# Patient Record
Sex: Female | Born: 2000 | Race: Black or African American | Hispanic: No | Marital: Single | Smoking: Never smoker
Health system: Southern US, Community
[De-identification: ages and names within clinical notes are randomized; demographics above are authoritative.]

## PROBLEM LIST (undated history)

## (undated) DIAGNOSIS — L509 Urticaria, unspecified: Secondary | ICD-10-CM

## (undated) DIAGNOSIS — R55 Syncope and collapse: Secondary | ICD-10-CM

## (undated) DIAGNOSIS — J45909 Unspecified asthma, uncomplicated: Secondary | ICD-10-CM

## (undated) HISTORY — DX: Urticaria, unspecified: L50.9

---

## 2018-03-02 ENCOUNTER — Encounter (HOSPITAL_COMMUNITY): Payer: Self-pay

## 2018-03-02 DIAGNOSIS — R55 Syncope and collapse: Secondary | ICD-10-CM | POA: Diagnosis present

## 2018-03-02 LAB — CBC
HEMATOCRIT: 36.6 % (ref 36.0–46.0)
Hemoglobin: 11.9 g/dL — ABNORMAL LOW (ref 12.0–15.0)
MCH: 27 pg (ref 26.0–34.0)
MCHC: 32.5 g/dL (ref 30.0–36.0)
MCV: 83.2 fL (ref 78.0–100.0)
Platelets: 345 10*3/uL (ref 150–400)
RBC: 4.4 MIL/uL (ref 3.87–5.11)
RDW: 13.8 % (ref 11.5–15.5)
WBC: 7.7 10*3/uL (ref 4.0–10.5)

## 2018-03-02 LAB — BASIC METABOLIC PANEL
ANION GAP: 10 (ref 5–15)
BUN: 13 mg/dL (ref 6–20)
CHLORIDE: 106 mmol/L (ref 101–111)
CO2: 26 mmol/L (ref 22–32)
Calcium: 9.6 mg/dL (ref 8.9–10.3)
Creatinine, Ser: 1.05 mg/dL — ABNORMAL HIGH (ref 0.44–1.00)
Glucose, Bld: 94 mg/dL (ref 65–99)
POTASSIUM: 3.7 mmol/L (ref 3.5–5.1)
SODIUM: 142 mmol/L (ref 135–145)

## 2018-03-02 LAB — I-STAT BETA HCG BLOOD, ED (MC, WL, AP ONLY)

## 2018-03-02 LAB — CBG MONITORING, ED: Glucose-Capillary: 104 mg/dL — ABNORMAL HIGH (ref 65–99)

## 2018-03-02 NOTE — ED Notes (Signed)
While doing EKG, pt ask "when can I go home, I don't like hospitals"  Pt was informed that it will be best if she is seen by the doctor before she decides to leave.

## 2018-03-02 NOTE — ED Triage Notes (Signed)
BIB EMS from [facility] for syncopal episode witnessed by classmates.   EMS Vitals BP 138/96 P 110 RR 16 CBG 104

## 2018-03-03 ENCOUNTER — Emergency Department (HOSPITAL_COMMUNITY)
Admission: EM | Admit: 2018-03-03 | Discharge: 2018-03-03 | Disposition: A | Discharge status: Home / Self Care | Payer: BLUE CROSS/BLUE SHIELD | Attending: Emergency Medicine | Admitting: Emergency Medicine

## 2018-03-03 DIAGNOSIS — R55 Syncope and collapse: Secondary | ICD-10-CM

## 2018-03-03 HISTORY — DX: Syncope and collapse: R55

## 2018-03-03 LAB — URINALYSIS, ROUTINE W REFLEX MICROSCOPIC
Bilirubin Urine: NEGATIVE
Glucose, UA: NEGATIVE mg/dL
Hgb urine dipstick: NEGATIVE
Ketones, ur: NEGATIVE mg/dL
LEUKOCYTES UA: NEGATIVE
Nitrite: NEGATIVE
PROTEIN: NEGATIVE mg/dL
Specific Gravity, Urine: 1.018 (ref 1.005–1.030)
pH: 5 (ref 5.0–8.0)

## 2018-03-03 NOTE — ED Notes (Signed)
Pt aware of urine sample but pt is too out of it to go at this time.  RN notified.

## 2018-03-03 NOTE — ED Provider Notes (Signed)
[facility]-EMERGENCY DEPT Provider Note   CSN: [id number] Arrival date & time: 03/02/18  2213     History   Chief Complaint Chief Complaint  Patient presents with  . Syncope    HPI Cheryl Blanchard is a 17 y.o. female.  17 year old female presents to the emergency department for evaluation of a syncopal event.  She states that she was practicing a step routine for many hours when she felt lightheaded and passed out.  This has happened to her on 1 other occasion associated with hyperventilation from an anxiety attack.  She states that she feels as though she was breathing more rapidly prior to syncope today.  She denies any preceding chest pain, fevers, viral illness.  Patient complaining of mild leg soreness from frequent dance practice.  This is bilateral.  She initially was experiencing a headache, but this has resolved.  She was encouraged to come for evaluation by her friends.  She states that she is feeling fine and would like to go home.     Past Medical History:  Diagnosis Date  . Syncope     There are no active problems to display for this patient.   History reviewed. No pertinent surgical history.   OB History   None      Home Medications    Prior to Admission medications   Not on File    Family History History reviewed. No pertinent family history.  Social History Social History   Tobacco Use  . Smoking status: Never Smoker  . Smokeless tobacco: Never Used  Substance Use Topics  . Alcohol use: Not on file  . Drug use: Not on file     Allergies   Patient has no known allergies.   Review of Systems Review of Systems Ten systems reviewed and are negative for acute change, except as noted in the HPI.    Physical Exam Updated Vital Signs BP 116/63 (BP Location: Left Arm)   Pulse 90   Temp 99.2 F (37.3 C) (Oral)   Resp 16   Ht 5\' 6"  (1.676 m)   LMP  (LMP Unknown)   SpO2 100%   Physical Exam  Constitutional: She is  oriented to person, place, and time. She appears well-developed and well-nourished. No distress.  Nontoxic appearing in no distress.  HENT:  Head: Normocephalic and atraumatic.  Eyes: Conjunctivae and EOM are normal. No scleral icterus.  Neck: Normal range of motion.  Cardiovascular: Normal rate, regular rhythm and intact distal pulses.  Pulmonary/Chest: Effort normal. No stridor. No respiratory distress. She has no wheezes. She has no rales.  Lungs clear to auscultation bilaterally  Musculoskeletal: Normal range of motion.  Neurological: She is alert and oriented to person, place, and time. She exhibits normal muscle tone. Coordination normal.  GCS 15. Ambulatory with steady gait.  Skin: Skin is warm and dry. No rash noted. She is not diaphoretic. No erythema. No pallor.  Psychiatric: She has a normal mood and affect. Her behavior is normal.  Nursing note and vitals reviewed.    ED Treatments / Results  Labs (all labs ordered are listed, but only abnormal results are displayed) Labs Reviewed  BASIC METABOLIC PANEL - Abnormal; Notable for the following components:      Result Value   Creatinine, Ser 1.05 (*)    All other components within normal limits  CBC - Abnormal; Notable for the following components:   Hemoglobin 11.9 (*)    All other components within normal limits  CBG MONITORING, ED - Abnormal; Notable for the following components:   Glucose-Capillary 104 (*)    All other components within normal limits  URINALYSIS, ROUTINE W REFLEX MICROSCOPIC  I-STAT BETA HCG BLOOD, ED ([facility], [facility], [facility] ONLY)    EKG EKG Interpretation  Date/Time:  Tuesday March 02 2018 22:45:33 EDT Ventricular Rate:  97 PR Interval:    QRS Duration: 87 QT Interval:  348 QTC Calculation: 442 R Axis:   86 Text Interpretation:  Sinus rhythm Normal ECG No previous ECGs available Confirmed by Paula Libra ([id number]) on 03/02/2018 10:53:10 PM   Radiology No results found.  Procedures Procedures  (including critical care time)  Medications Ordered in ED Medications - No data to display   Initial Impression / Assessment and Plan / ED Course  I have reviewed the triage vital signs and the nursing notes.  Pertinent labs & imaging results that were available during my care of the patient were reviewed by me and considered in my medical decision making (see chart for details).     Patient presenting after a syncopal event.  She is feeling at baseline and expresses desire for discharge.  She denies any preceding chest pain, fevers, viral illness.  She states that she has had similar syncopal events in the past for which she was followed by her doctor for a short period.  Laboratory work-up today is reassuring.  EKG without ischemic changes.  Orthostatic vital signs are stable.  No concerning exam findings today.  I have encouraged the patient to drink fluids regularly and to eat frequent meals.  She has also been advised to get plenty of sleep nightly.  I do not believe further emergent evaluation is indicated.  [place] syncope score is negative.  Return precautions discussed and provided. Patient discharged in stable condition with no unaddressed concerns.   Final Clinical Impressions(s) / ED Diagnoses   Final diagnoses:  Syncope, unspecified syncope type    ED Discharge Orders    None       Antony Madura, PA-C 03/03/18 0333    Paula Libra, MD 03/03/18 [phone]

## 2018-03-03 NOTE — Discharge Instructions (Signed)
Your workup in the emergency department today was reassuring.  We recommend follow-up with a primary care doctor.  Get plenty of rest and drink plenty of fluids.  Eat regular meals.  You may return for any new or concerning symptoms.

## 2018-12-19 ENCOUNTER — Encounter (HOSPITAL_COMMUNITY): Payer: Self-pay | Admitting: Emergency Medicine

## 2018-12-19 ENCOUNTER — Emergency Department (HOSPITAL_COMMUNITY)
Admission: EM | Admit: 2018-12-19 | Discharge: 2018-12-19 | Disposition: A | Discharge status: Home / Self Care | Payer: BLUE CROSS/BLUE SHIELD | Attending: Emergency Medicine | Admitting: Emergency Medicine

## 2018-12-19 ENCOUNTER — Other Ambulatory Visit: Payer: Self-pay

## 2018-12-19 DIAGNOSIS — J45909 Unspecified asthma, uncomplicated: Secondary | ICD-10-CM | POA: Diagnosis not present

## 2018-12-19 DIAGNOSIS — L509 Urticaria, unspecified: Secondary | ICD-10-CM | POA: Diagnosis present

## 2018-12-19 DIAGNOSIS — F17200 Nicotine dependence, unspecified, uncomplicated: Secondary | ICD-10-CM | POA: Insufficient documentation

## 2018-12-19 DIAGNOSIS — T7840XA Allergy, unspecified, initial encounter: Secondary | ICD-10-CM

## 2018-12-19 HISTORY — DX: Unspecified asthma, uncomplicated: J45.909

## 2018-12-19 MED ORDER — DIPHENHYDRAMINE HCL 25 MG PO CAPS
25.0000 mg | ORAL_CAPSULE | Freq: Once | ORAL | Status: AC
  Administered 2018-12-19: 25 mg via ORAL
  Filled 2018-12-19: qty 1

## 2018-12-19 MED ORDER — PREDNISONE 20 MG PO TABS
60.0000 mg | ORAL_TABLET | Freq: Once | ORAL | Status: AC
  Administered 2018-12-19: 60 mg via ORAL
  Filled 2018-12-19: qty 3

## 2018-12-19 MED ORDER — FAMOTIDINE 20 MG PO TABS: 20.0000 mg | ORAL_TABLET | Freq: Two times a day (BID) | ORAL | 0 refills | Status: DC

## 2018-12-19 MED ORDER — DIPHENHYDRAMINE HCL 25 MG PO TABS: 25.0000 mg | ORAL_TABLET | Freq: Three times a day (TID) | ORAL | 0 refills | Status: DC | PRN

## 2018-12-19 MED ORDER — FAMOTIDINE 20 MG PO TABS
20.0000 mg | ORAL_TABLET | Freq: Once | ORAL | Status: AC
  Administered 2018-12-19: 20 mg via ORAL
  Filled 2018-12-19: qty 1

## 2018-12-19 MED ORDER — PREDNISONE 20 MG PO TABS: 40.0000 mg | ORAL_TABLET | Freq: Every day | ORAL | 0 refills | Status: DC

## 2018-12-19 NOTE — Discharge Instructions (Addendum)
Evaluated today for allergic reaction. I have have given you a steroid prescription.  Please take as prescribed.  Please also continue to take Benadryl, 25 mg every 8 hours over the next 3 days.  Please also continue to take Pepcid, 20 mg over the next 3 days.  If you develop any sensation of throat closing, facial swelling, lightheaded or dizziness, nausea or vomiting, shortness of breath please return to the emergency department for evaluation.

## 2018-12-19 NOTE — ED Provider Notes (Signed)
[facility] EMERGENCY [facility] Provider Note   CSN: [id number] Arrival date & time: 12/19/18  [id number]   History   Chief Complaint Chief Complaint  Patient presents with  . Allergic Reaction    HPI Cheryl Blanchard is a 18 y.o. female with past medical history significant for asthma who presents for evaluation of allergic reaction.  Patient states she was [employer] yesterday evening, which she has had previously when she returned home she states that she noticed a "welt" to her left leg and lower portion of her back.  Patient states approximately 30 minutes later she noticed multiple areas that were pleuritic in nature.  Patient states she took #2, 12.5 mg p.o. Benadryl yesterday evening approximately 7 PM yesterday evening.  Patient states she woke up this morning and noticed increase area of urticaria to her face, posterior back, chest, abdomen as well as bilateral upper and lower extremities.  Patient denies new foods, new lotions, perfumes.  Patient denies previous history of allergic reactions.  Denies sensation of oral swelling or sensation of throat closing.  Denies fever, chills, nausea, vomiting, neck pain, neck stiffness, sore throat, facial swelling, cough, wheezing, shortness of breath, chest pain, abdominal pain, diarrhea, dysuria, constipation.  Denies additional aggravating or alleviating factors.  History provided by patient.  No interpreter was used.  HPI  Past Medical History:  Diagnosis Date  . Asthma   . Syncope     There are no active problems to display for this patient.   No past surgical history on file.   OB History   No obstetric history on file.      Home Medications    Prior to Admission medications   Medication Sig Start Date End Date Taking? Authorizing Provider  diphenhydrAMINE (BENADRYL) 25 MG tablet Take 1 tablet (25 mg total) by mouth every 8 (eight) hours as needed for up to 3 days for itching. 12/19/18 12/22/18  Adlyn Fife,  Ahmod Gillespie A, PA-C  famotidine (PEPCID) 20 MG tablet Take 1 tablet (20 mg total) by mouth 2 (two) times daily. 12/19/18   Kyrese Gartman A, PA-C  predniSONE (DELTASONE) 20 MG tablet Take 2 tablets (40 mg total) by mouth daily for 3 days. 12/19/18 12/22/18  Arely Tinner A, PA-C    Family History No family history on file.  Social History Social History   Tobacco Use  . Smoking status: Current Every Day Smoker  . Smokeless tobacco: Never Used  Substance Use Topics  . Alcohol use: Not on file  . Drug use: Not on file     Allergies   Patient has no known allergies.   Review of Systems Review of Systems  Constitutional: Negative.   HENT: Negative.   Eyes: Negative.   Respiratory: Negative.   Cardiovascular: Negative.   Gastrointestinal: Negative.   Genitourinary: Negative.   Musculoskeletal: Negative.   Skin: Positive for wound.  Neurological: Negative.   All other systems reviewed and are negative.    Physical Exam Updated Vital Signs BP 119/74 (BP Location: Left Arm)   Pulse 80   Temp 98.2 F (36.8 C)   Resp 16   SpO2 97%   Physical Exam Vitals signs and nursing note reviewed.  Constitutional:      General: She is not in acute distress.    Appearance: She is well-developed. She is not ill-appearing, toxic-appearing or diaphoretic.  HENT:     Head: Normocephalic and atraumatic.     Nose: Nose normal.     Mouth/Throat:  Comments: Posterior oropharynx clear.  No oropharyngeal swelling.  Tonsils without edema or exudate.  Uvula midline deviation.  No oropharyngeal lesions.  No drooling, trismus, phonation changes. Eyes:     Extraocular Movements: Extraocular movements intact.     Pupils: Pupils are equal, round, and reactive to light.  Neck:     Musculoskeletal: Normal range of motion.     Comments: No neck stiffness or neck rigidity Cardiovascular:     Rate and Rhythm: Normal rate.     Pulses: Normal pulses.     Heart sounds: Normal heart sounds. No  murmur. No gallop.   Pulmonary:     Effort: No respiratory distress.     Comments: Clear to auscultation bilaterally without wheeze, rhonchi or rales.  No tachypnea.  Oxygen saturation 98% on room air with good waveform.  Able to speak in full sentences without difficulty.  No evidence of acute respiratory distress. Abdominal:     General: There is no distension.     Comments: Soft, nontender without rebound or guarding.  Musculoskeletal: Normal range of motion.     Comments: Moves all extremities without difficulty.  Skin:    General: Skin is warm and dry.     Comments: Multiple areas of urticaria varying sizes to face, posterior back, chest, abdomen, bilateral upper and lower extremities.  No oropharyngeal lesions.  Neurological:     Mental Status: She is alert.      ED Treatments / Results  Labs (all labs ordered are listed, but only abnormal results are displayed) Labs Reviewed - No data to display  EKG None  Radiology No results found.  Procedures Procedures (including critical care time)  Medications Ordered in ED Medications  famotidine (PEPCID) tablet 20 mg (20 mg Oral Given 12/19/18 1018)  predniSONE (DELTASONE) tablet 60 mg (60 mg Oral Given 12/19/18 1018)  diphenhydrAMINE (BENADRYL) capsule 25 mg (25 mg Oral Given 12/19/18 1018)     Initial Impression / Assessment and Plan / ED Course  I have reviewed the triage vital signs and the nursing notes.  Pertinent labs & imaging results that were available during my care of the patient were reviewed by me and considered in my medical decision making (see chart for details).  18 year old female who presented as well presents for evaluation after allergic reaction.  Afebrile, nonseptic, non-ill-appearing.  No evidence of anaphylaxis.  Lungs clear to auscultation bilaterally no wheeze, rales.  No oropharyngeal lesions.  Patient with varying sizes of urticaria to face, posterior back, chest, abdomen as well as bilateral  upper and lower extremities.  Will give patient Pepcid, Benadryl, steroids and reevaluate.  No evidence of acute respiratory distress.  We will continue to monitor.  1115: On reevaluation, patient with resolvment of urticaria except for #2, 5 mm lesions to ventral surface of right wrist.  No evidence of anaphylaxis.  No sensation of throat closing, cough, oropharyngeal swelling, nausea, vomiting, dizziness, palpitations.  No additional areas of urticaria after treatment.  Patient is hemodynamically stable and appropriate for DC home at this time.  She has not had any evidence of anaphylaxis after exposure approximately 18 hours ago.  Symptoms have resolved with treatment emergency department.  Will DC patient home on Pepcid, steroids as well as Benadryl.  Discussed with patient and friend strict return precautions.  Patient voiced understanding and is agreeable for follow-up.    Final Clinical Impressions(s) / ED Diagnoses   Final diagnoses:  Allergic reaction, initial encounter    ED Discharge Orders  Ordered    famotidine (PEPCID) 20 MG tablet  2 times daily     12/19/18 1123    predniSONE (DELTASONE) 20 MG tablet  Daily     12/19/18 1123    diphenhydrAMINE (BENADRYL) 25 MG tablet  Every 8 hours PRN     12/19/18 1123           Tiffanee Mcnee A, PA-C 12/19/18 1139    Azalia Bilisampos, Kevin, MD 12/19/18 1627

## 2018-12-19 NOTE — ED Notes (Signed)
Patient verbalizes understanding of discharge instructions. Opportunity for questioning and answers were provided. Armband removed by staff, pt discharged from ED ambulatory.   

## 2018-12-19 NOTE — ED Triage Notes (Signed)
hives that started last night , whelps on abd back  That are itchy, face feels itchy , pt handling secretions well, denies dyspahia

## 2018-12-21 ENCOUNTER — Emergency Department (HOSPITAL_COMMUNITY)
Admission: EM | Admit: 2018-12-21 | Discharge: 2018-12-22 | Disposition: A | Discharge status: Home / Self Care | Payer: BLUE CROSS/BLUE SHIELD | Attending: Emergency Medicine | Admitting: Emergency Medicine

## 2018-12-21 ENCOUNTER — Other Ambulatory Visit: Payer: Self-pay

## 2018-12-21 DIAGNOSIS — Z79899 Other long term (current) drug therapy: Secondary | ICD-10-CM | POA: Diagnosis not present

## 2018-12-21 DIAGNOSIS — J45909 Unspecified asthma, uncomplicated: Secondary | ICD-10-CM | POA: Insufficient documentation

## 2018-12-21 DIAGNOSIS — F17210 Nicotine dependence, cigarettes, uncomplicated: Secondary | ICD-10-CM | POA: Insufficient documentation

## 2018-12-21 DIAGNOSIS — L509 Urticaria, unspecified: Secondary | ICD-10-CM | POA: Insufficient documentation

## 2018-12-21 NOTE — ED Triage Notes (Signed)
Patient c/o that she is breaking out in hives. Hives are located on face and all extremities, but not trunk.

## 2018-12-22 LAB — RPR: RPR Ser Ql: NONREACTIVE

## 2018-12-22 MED ORDER — KETOROLAC TROMETHAMINE 30 MG/ML IJ SOLN
30.0000 mg | Freq: Once | INTRAMUSCULAR | Status: AC
  Administered 2018-12-22: 30 mg via INTRAVENOUS
  Filled 2018-12-22: qty 1

## 2018-12-22 MED ORDER — METHYLPREDNISOLONE SODIUM SUCC 125 MG IJ SOLR
125.0000 mg | Freq: Once | INTRAMUSCULAR | Status: AC
  Administered 2018-12-22: 125 mg via INTRAVENOUS
  Filled 2018-12-22: qty 2

## 2018-12-22 MED ORDER — FAMOTIDINE IN NACL 20-0.9 MG/50ML-% IV SOLN
20.0000 mg | INTRAVENOUS | Status: AC
  Administered 2018-12-22: 20 mg via INTRAVENOUS
  Filled 2018-12-22: qty 50

## 2018-12-22 MED ORDER — DIPHENHYDRAMINE HCL 50 MG/ML IJ SOLN
12.5000 mg | Freq: Once | INTRAMUSCULAR | Status: AC
  Administered 2018-12-22: 12.5 mg via INTRAVENOUS
  Filled 2018-12-22: qty 1

## 2018-12-22 MED ORDER — PREDNISONE 20 MG PO TABS: 40.0000 mg | ORAL_TABLET | Freq: Every day | ORAL | 0 refills | Status: DC

## 2018-12-22 MED ORDER — SODIUM CHLORIDE 0.9 % IV BOLUS
1000.0000 mL | Freq: Once | INTRAVENOUS | Status: AC
  Administered 2018-12-22: 1000 mL via INTRAVENOUS

## 2018-12-22 MED ORDER — DIPHENHYDRAMINE HCL 50 MG/ML IJ SOLN
25.0000 mg | Freq: Once | INTRAMUSCULAR | Status: AC
  Administered 2018-12-22: 25 mg via INTRAVENOUS
  Filled 2018-12-22: qty 1

## 2018-12-22 NOTE — Discharge Instructions (Signed)
We recommend continued use of Benadryl and Pepcid for management of itching.  You have been prescribed a steroid taper.  Take as prescribed until finished.  You may also benefit from use of daily Zyrtec or Claritin.  We recommend close outpatient follow-up for allergy testing.  You may return to the emergency department if symptoms worsen, such as if you develop associated fever, difficulty breathing, difficulty swallowing.

## 2018-12-22 NOTE — ED Provider Notes (Signed)
[facility] EMERGENCY DEPARTMENT Provider Note   CSN: [id number] Arrival date & time: 12/21/18  1942     History   Chief Complaint Chief Complaint  Patient presents with  . Urticaria    HPI Cheryl Blanchard is a 18 y.o. female.  18 year old female presents to the emergency department for evaluation of an urticarial rash.  Rash has been present over the past 4 days.  She was seen on day 1 of symptoms and treated in the emergency department with oral prednisone, Benadryl, Pepcid.  She had some mild improvement to her symptoms, but noted recurrence of rash later that evening.  She was [facility] student [facility] center on Monday and received a steroid shot, but has not had any improvement with this.  Continues to note constant itching throughout her body.  She has had a burning discomfort to her inner and posterior thighs.  She denies any lip or tongue swelling, difficulty breathing, difficulty swallowing, fevers, vomiting.  No new soaps, lotions, detergents, medications.  Denies contact with persons with similar rash.  The history is provided by the patient. No language interpreter was used.    Past Medical History:  Diagnosis Date  . Asthma   . Syncope     There are no active problems to display for this patient.   No past surgical history on file.   OB History   No obstetric history on file.      Home Medications    Prior to Admission medications   Medication Sig Start Date End Date Taking? Authorizing Provider  diphenhydrAMINE (BENADRYL) 25 MG tablet Take 1 tablet (25 mg total) by mouth every 8 (eight) hours as needed for up to 3 days for itching. 12/19/18 12/22/18 Yes Henderly, Britni A, PA-C  famotidine (PEPCID) 20 MG tablet Take 1 tablet (20 mg total) by mouth 2 (two) times daily. 12/19/18  Yes Henderly, Britni A, PA-C  predniSONE (DELTASONE) 20 MG tablet Take 2 tablets (40 mg total) by mouth daily. Take 40 mg by mouth daily for 3 days, then 20mg  by mouth  daily for 3 days, then 10mg  daily for 3 days 12/22/18   Antony Madura, PA-C    Family History No family history on file.  Social History Social History   Tobacco Use  . Smoking status: Current Every Day Smoker  . Smokeless tobacco: Never Used  Substance Use Topics  . Alcohol use: Not on file  . Drug use: Not on file     Allergies   Patient has no known allergies.   Review of Systems Review of Systems Ten systems reviewed and are negative for acute change, except as noted in the HPI.    Physical Exam Updated Vital Signs BP 119/74   Pulse 65   Temp 97.7 F (36.5 C) (Oral)   Resp 17   Ht 5\' 6"  (1.676 m)   Wt 62.1 kg   SpO2 97%   BMI 22.11 kg/m   Physical Exam Vitals signs and nursing note reviewed.  Constitutional:      General: She is not in acute distress.    Appearance: She is well-developed. She is not diaphoretic.     Comments: Nontoxic appearing and in NAD  HENT:     Head: Normocephalic and atraumatic.     Right Ear: External ear normal.     Left Ear: External ear normal.     Mouth/Throat:     Mouth: Mucous membranes are moist.     Comments:  No angioedema. Patient tolerating secretions without difficulty. Normal phonation. Eyes:     General: No scleral icterus.    Conjunctiva/sclera: Conjunctivae normal.  Neck:     Musculoskeletal: Normal range of motion.  Cardiovascular:     Rate and Rhythm: Normal rate and regular rhythm.     Pulses: Normal pulses.  Pulmonary:     Effort: Pulmonary effort is normal. No respiratory distress.     Breath sounds: No stridor. No wheezing or rales.     Comments: Respirations even and unlabored Musculoskeletal: Normal range of motion.  Skin:    General: Skin is warm and dry.     Coloration: Skin is not pale.     Findings: Rash present. No erythema.     Comments: Maculopapular, pruritic, raised, blanching rash c/w urticaria noted throughout body; mostly on BLE and trunk. [place] erythematous rash to bilateral hands.    Neurological:     Mental Status: She is alert and oriented to person, place, and time.     Coordination: Coordination normal.  Psychiatric:        Behavior: Behavior normal.      ED Treatments / Results  Labs (all labs ordered are listed, but only abnormal results are displayed) Labs Reviewed  RPR    EKG None  Radiology No results found.  Procedures Procedures (including critical care time)  Medications Ordered in ED Medications  diphenhydrAMINE (BENADRYL) injection 25 mg (25 mg Intravenous Given 12/22/18 0347)  famotidine (PEPCID) IVPB 20 mg premix (0 mg Intravenous Stopped 12/22/18 0559)  methylPREDNISolone sodium succinate (SOLU-MEDROL) 125 mg/2 mL injection 125 mg (125 mg Intravenous Given 12/22/18 0347)  sodium chloride 0.9 % bolus 1,000 mL (0 mLs Intravenous Stopped 12/22/18 0559)  diphenhydrAMINE (BENADRYL) injection 12.5 mg (12.5 mg Intravenous Given 12/22/18 0600)  ketorolac (TORADOL) 30 MG/ML injection 30 mg (30 mg Intravenous Given 12/22/18 0559)    5:03 AM Hives less erythematous and pruritic, but still present. No difficulty breathing or swallowing. No hypoxia.   Initial Impression / Assessment and Plan / ED Course  I have reviewed the triage vital signs and the nursing notes.  Pertinent labs & imaging results that were available during my care of the patient were reviewed by me and considered in my medical decision making (see chart for details).     18 year old female is presenting for persistent urticaria x4 days.  She has no angioedema, difficulty breathing, difficulty swallowing.  Does have been stable without hypoxia.  She is afebrile.  Denies any new food ingestions, medications, soaps, lotions, detergents prior to onset of urticaria.  Was previously evaluated in the emergency department for same.  Discharged with 3 days of prednisone.  Also received a steroid shot at her student health clinic without relief.  She has diffuse urticaria on exam which has  moderately improved with IV Benadryl, steroids, Pepcid.  I have discussed the need for patient to follow-up for allergy testing giving persistent symptoms.  I do not feel she requires further emergent work-up or admission at this time.  She has been counseled on reasons to return to the emergency department including difficulty breathing or difficulty swallowing.  Patient discharged in stable condition with no unaddressed concerns.   Final Clinical Impressions(s) / ED Diagnoses   Final diagnoses:  Urticaria    ED Discharge Orders         Ordered    predniSONE (DELTASONE) 20 MG tablet  Daily     12/22/18 0647  Antony Madura, PA-C 12/22/18 6389    Nira Conn, MD 12/22/18 [phone]

## 2018-12-22 NOTE — ED Notes (Signed)
Patient verbalized understanding of dc instructions. Vss, ambulatory upon discharge.

## 2018-12-29 ENCOUNTER — Encounter: Payer: Self-pay | Admitting: Allergy

## 2018-12-29 ENCOUNTER — Ambulatory Visit: Payer: BLUE CROSS/BLUE SHIELD | Admitting: Allergy

## 2018-12-29 VITALS — BP 122/78 | HR 95 | Temp 98.3°F | Resp 18 | Ht 64.0 in | Wt 172.0 lb

## 2018-12-29 DIAGNOSIS — T783XXD Angioneurotic edema, subsequent encounter: Secondary | ICD-10-CM

## 2018-12-29 DIAGNOSIS — J4520 Mild intermittent asthma, uncomplicated: Secondary | ICD-10-CM

## 2018-12-29 DIAGNOSIS — L508 Other urticaria: Secondary | ICD-10-CM | POA: Diagnosis not present

## 2018-12-29 NOTE — Patient Instructions (Signed)
Hives  - at this time etiology of hives and swelling is unknown.  Hives can be caused by a variety of different triggers including illness/infection, foods, medications, stings, exercise, pressure, vibrations, extremes of temperature to name a few however majority of the time there is no identifiable trigger.  Your symptoms were acute (lasting <6 weeks).  If hives return and become chronic (>6 week) would recommend further evaluation with following labwork: CBC w diff, CMP, tryptase, hive panel, alpha-gal panel  - environmental allergy skin testing is positive to pollens and mold.  Allergen avoidance measures provided  - common food allergy skin testing is negative thus not concerned for food allergy at this time  - if hives return recommend taking the following: Zyrtec 10mg  1 tablet twice a day with Pepcid 20mg  1 tablet twice a day.   Let us know if his regimen is not effective for managing hives  - we briefly discussed Xolair monthly injections if hives become chronic and not controlled with high-dose antihistamine regimen as above.   Asthma  - have access to albuterol inhaler 2 puffs every 4-6 hours as needed for cough/wheeze/shortness of breath/chest tightness.  May use 15-20 minutes prior to activity.   Monitor frequency of use.    Asthma control goals:   Full participation in all desired activities (may need albuterol before activity)  Albuterol use two time or less a week on average (not counting use with activity)  Cough interfering with sleep two time or less a month  Oral steroids no more than once a year  No hospitalizations   Follow-up 3-4 months or sooner if needed

## 2018-12-29 NOTE — Progress Notes (Signed)
New Patient Note  RE: Cheryl Blanchard MRN: [id number] DOB: 02-12-2001 Date of Office Visit: 12/29/2018  Referring provider: Lupita Dawn, FNP Primary care provider: Patient, No Pcp Per  Chief Complaint: rash  History of present illness: Cheryl Blanchard is a 18 y.o. female presenting today for consultation for hives.  She presents today with her dad and aunt.  1.5 weeks ago she broke out in hives.  She provided pictures which are consistent with urticaria.  No previous history of hives.  She states she has not had any further hives since Monday of this week (2 days ago).  At the onset of the hives she went to the emergency room on January 26 and on exam she was noted to have "multiple areas of urticaria varying sizes to face, posterior back, chest, abdomen, bilateral upper and lower extremities."  She was treated with Pepcid, Benadryl and 60 mg of prednisone.  She was able to DC home to continue on Pepcid, Benadryl and the prednisone.  She states prior to the onset of hives she had eaten [employer] which she normally does without any problems.  She states she noticed that evening a welts on her leg and lower back and states that about 30 minutes later she had multiple areas of of hives that were extremely itchy.  She took Benadryl however the hives continued to spread and that she went to the emergency department as above.  The following day after the ED visit on 12/19/2018 she states she continued to have further outbreaks of hives despite the medications and she [facility] she [facility].  She states she continues to have further new outbreaks so the next day on January 28 she went back to the emergency department.  She was again noted to have a "macular papular, pruritic, raised, blanching rash consistent with urticaria noted throughout the body; mostly on BLE and trunk.  Lacy erythematous rash to bilateral hands."  She was treated with  Benadryl IV, Pepcid IV, Solu-Medrol IV, IV fluid, Benadryl IV and Toradol IV.  The hives did improve but did not completely resolve.  She was discharged to continue additional days of prednisone.  She does report lip swelling and eye puffiness with the hives.  No joint aches/pain.  No fevers.  No marks/bruising left behind.   No preceding illnesses, no new medications, no new foods, no stings or change in soaps/detergents/lotions.  She states she changed her sheets and bought fresh clothes and towels that would not have her detergent on it to make sure that was not a trigger.  She has a history of asthma.  She states ashtma is controlled but states triggers are being overheated, exercise/activity.  She has albuterol that she does use albuterol prior to activity.    No history of eczema or food allergy.  She denies having any significant symptoms consistent with allergic rhinitis or conjunctivitis.  She completed a study abroad from September to December in [place].  Review of systems: Review of Systems  Constitutional: Negative for chills, fever and malaise/fatigue.  HENT: Negative for congestion, ear discharge, ear pain, nosebleeds and sore throat.   Eyes: Negative for pain, discharge and redness.  Respiratory: Negative for cough, shortness of breath and wheezing.   Cardiovascular: Negative for chest pain.  Gastrointestinal: Negative for abdominal pain, constipation, diarrhea, heartburn, nausea and vomiting.  Musculoskeletal: Negative for joint pain.  Skin: Positive for itching and rash.  Neurological: Negative for headaches.  All other systems negative unless noted above in HPI  Past medical history: Past Medical History:  Diagnosis Date  . Asthma   . Syncope   . Urticaria     Past surgical history: History reviewed. No pertinent surgical history.  Family history:  Family History  Problem Relation Age of Onset  . Urticaria Father   . Polycystic kidney disease Father   .  Polycystic kidney disease Mother   . Polycystic kidney disease Paternal Grandmother   . Allergic rhinitis Neg Hx   . Angioedema Neg Hx   . Asthma Neg Hx   . Atopy Neg Hx   . Eczema Neg Hx   . Immunodeficiency Neg Hx     Social history: Family lives in [place].  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  She lives in a dorm that does not have carpeting with electric heating and central cooling.  There are no pets.  There is no known concern for water damage, mildew or roaches.  She denies a smoking history.  Medication List: Allergies as of 12/29/2018   No Known Allergies     Medication List       Accurate as of December 29, 2018  1:52 PM. Always use your most recent med list.        albuterol 108 (90 Base) MCG/ACT inhaler Commonly known as:  PROVENTIL HFA;VENTOLIN HFA Inhale into the lungs every 6 (six) hours as needed for wheezing or shortness of breath.       Known medication allergies: No Known Allergies   Physical examination: Blood pressure 122/78, pulse 95, temperature 98.3 F (36.8 C), temperature source Oral, resp. rate 18, height 5\' 4"  (1.626 m), weight 172 lb (78 kg), SpO2 97 %.  General: Alert, interactive, in no acute distress. HEENT: PERRLA, TMs pearly gray, turbinates non-edematous without discharge, post-pharynx non erythematous. Neck: Supple without lymphadenopathy. Lungs: Clear to auscultation without wheezing, rhonchi or rales. {no increased work of breathing. CV: Normal S1, S2 without murmurs. Abdomen: Nondistended, nontender. Skin: Warm and dry, without lesions or rashes. Extremities:  No clubbing, cyanosis or edema. Neuro:   Grossly intact.  Diagnositics/Labs:  Spirometry: FEV1: 2.41L 82%, FVC: 3.08L 93%, ratio consistent with Nonobstructive pattern  Allergy testing: Environmental allergy skin prick testing is positive to grass pollens, weed pollens, walnut tree pollen, botryitis cinera. 10 common food allergens are  negative. Intradermal testing is negative. Allergy testing results were read and interpreted by provider, documented by clinical staff.   Assessment and plan:   Acute urticaria with angioedema  - at this time etiology of hives and swelling is unknown.  Hives can be caused by a variety of different triggers including illness/infection, foods, medications, stings, exercise, pressure, vibrations, extremes of temperature to name a few however majority of the time there is no identifiable trigger.  Your symptoms were acute (lasting <6 weeks).  If hives return and become chronic (>6 week) would recommend further evaluation with following labwork: CBC w diff, CMP, tryptase, hive panel, alpha-gal panel  - environmental allergy skin testing is positive to pollens and mold.  Allergen avoidance measures provided  - common food allergy skin testing is negative thus not concerned for food allergy at this time  - if hives return recommend taking the following: Zyrtec 10mg  1 tablet twice a day with Pepcid 20mg  1 tablet twice a day.   Let us know if his regimen is not effective for managing hives  - we briefly discussed Xolair monthly injections  if hives become chronic and not controlled with high-dose antihistamine regimen as above.   Asthma  - have access to albuterol inhaler 2 puffs every 4-6 hours as needed for cough/wheeze/shortness of breath/chest tightness.  May use 15-20 minutes prior to activity.   Monitor frequency of use.    Asthma control goals:   Full participation in all desired activities (may need albuterol before activity)  Albuterol use two time or less a week on average (not counting use with activity)  Cough interfering with sleep two time or less a month  Oral steroids no more than once a year  No hospitalizations   Follow-up 3-4 months or sooner if needed     I appreciate the opportunity to take part in Cheryl Blanchard's care. Please do not hesitate to contact me with  questions.  Sincerely,   Margo AyeShaylar Jasmina Gendron, MD Allergy/Immunology [facility]

## 2018-12-30 ENCOUNTER — Ambulatory Visit: Payer: Self-pay | Admitting: Allergy and Immunology

## 2019-01-18 ENCOUNTER — Ambulatory Visit: Payer: Self-pay | Admitting: Allergy & Immunology

## 2019-04-01 ENCOUNTER — Ambulatory Visit: Payer: BLUE CROSS/BLUE SHIELD | Admitting: Allergy

## 2019-04-08 ENCOUNTER — Ambulatory Visit (INDEPENDENT_AMBULATORY_CARE_PROVIDER_SITE_OTHER): Payer: BLUE CROSS/BLUE SHIELD | Admitting: Allergy

## 2019-04-08 ENCOUNTER — Encounter: Payer: Self-pay | Admitting: Allergy

## 2019-04-08 DIAGNOSIS — T783XXD Angioneurotic edema, subsequent encounter: Secondary | ICD-10-CM

## 2019-04-08 DIAGNOSIS — L508 Other urticaria: Secondary | ICD-10-CM | POA: Diagnosis not present

## 2019-04-08 DIAGNOSIS — J4520 Mild intermittent asthma, uncomplicated: Secondary | ICD-10-CM

## 2019-04-08 NOTE — Patient Instructions (Addendum)
Hives  - at this time etiology of hives and swelling is is likely driven by allergens as her environmental allergy testing was positive to grass pollens, weed pollens, tree pollens and mold.  Hives can be caused by a variety of different triggers including aeroallergens, illness/infection, foods, medications, stings, exercise, pressure, vibrations, extremes of temperature to name a few however majority of the time there is no identifiable trigger.   Since she has continued to have outbreaks throughout has 3 months of hives are now considered chronic in nature and at her next visit would recommend further evaluation with following labwork: CBC w diff, CMP, tryptase, hive panel, alpha-gal panel  -For management of hives take the following: Zyrtec 10mg  or Xyzal 5 mg 1 tablet twice a day with Pepcid 20mg  1 tablet twice a day.   Let us know if his regimen is not effective for managing hives  -If needed for more control consider adding in Singulair.  If high-dose antihistamine regimen is not effective then we have briefly discussed Xolair monthly injections.  Asthma  - have access to albuterol inhaler 2 puffs every 4-6 hours as needed for cough/wheeze/shortness of breath/chest tightness.  May use 15-20 minutes prior to activity.   Monitor frequency of use.    Asthma control goals:   Full participation in all desired activities (may need albuterol before activity)  Albuterol use two time or less a week on average (not counting use with activity)  Cough interfering with sleep two time or less a month  Oral steroids no more than once a year  No hospitalizations   Follow-up 3-4 months or sooner if needed

## 2019-04-08 NOTE — Progress Notes (Signed)
RE: Cheryl Blanchard MRN: [id number] DOB: 2001/10/27 Date of Telemedicine Visit: 04/08/2019  Referring provider: No ref. provider found Primary care provider: Patient, No Pcp Per  Chief Complaint: Urticaria (she did have one flare up about a month ago when she moved back home. she thinks the pollen count may have been higher then. )   Telemedicine Follow Up Visit via Telephone: I connected with Cheryl Blanchard for a follow up on 04/08/19 by telephone and verified that I am speaking with the correct person using two identifiers.   I discussed the limitations, risks, security and privacy concerns of performing an evaluation and management service by telephone and the availability of in person appointments. I also discussed with the patient that there may be a patient responsible charge related to this service. The patient expressed understanding and agreed to proceed.  Patient is at home.  Provider is at the office.  Visit start time: 0855 Visit end time: 0908 Insurance consent/check in by: Marlene Bast Medical consent and medical assistant/nurse: Dorathy Daft B  History of Present Illness: She is a 18 y.o. female, who is being followed for urticaria with angioedema and asthma. Her previous allergy office visit was on 12/29/18 with Dr. Delorse Lek.   She states after her last visit that her hives and swelling did improve however college closed down due to coronavirus and she went back home to [place] and states that around the time she went home there was a high pollen activity and she had a big flare of her hives.  She states for the hives she took her Zyrtec or Xyzal as well as occasional Benadryl and also applying calamine lotion.  She states that the flare has since improved and that she may have just little breakouts here and there where the hives are not as big in size that can occur on her arms or legs.  This was about 1/2 months ago.  She also states at the same time of the flare of her hives and going home  she started feeling chest tightness sensations.  She states that she describes the feeling in her chest as like a lump sitting in her chest.  She was using her albuterol twice a day.  She states that this feeling has since resolved.  She did not require visits to urgent care or ED or to her primary care and also did not require any systemic steroids.  Assessment and Plan: Wynesha is a 18 y.o. female with: Chronic urticaria with angioedema  - at this time etiology of hives and swelling is is likely driven by allergens as her environmental allergy testing was positive to grass pollens, weed pollens, tree pollens and mold.  Hives can be caused by a variety of different triggers including aeroallergens, illness/infection, foods, medications, stings, exercise, pressure, vibrations, extremes of temperature to name a few however majority of the time there is no identifiable trigger.   Since she has continued to have outbreaks throughout has 3 months of hives are now considered chronic in nature and at her next visit would recommend further evaluation with following labwork: CBC w diff, CMP, tryptase, hive panel, alpha-gal panel  -For management of hives take the following: Zyrtec 10mg  or Xyzal 5 mg 1 tablet twice a day with Pepcid 20mg  1 tablet twice a day.   Let us know if his regimen is not effective for managing hives  -If needed for more control consider adding in Singulair.  If high-dose antihistamine regimen is not effective then we  have briefly discussed Xolair monthly injections.  Asthma, mild intermittent  - have access to albuterol inhaler 2 puffs every 4-6 hours as needed for cough/wheeze/shortness of breath/chest tightness.  May use 15-20 minutes prior to activity.   Monitor frequency of use.    Asthma control goals:   Full participation in all desired activities (may need albuterol before activity)  Albuterol use two time or less a week on average (not counting use with activity)  Cough  interfering with sleep two time or less a month  Oral steroids no more than once a year  No hospitalizations  Follow-up 3-4 months or sooner if needed    Diagnostics: None.  Medication List:  Current Outpatient Medications  Medication Sig Dispense Refill  . albuterol (PROVENTIL HFA;VENTOLIN HFA) 108 (90 Base) MCG/ACT inhaler Inhale into the lungs every 6 (six) hours as needed for wheezing or shortness of breath.    . levocetirizine (XYZAL) 5 MG tablet Take 5 mg by mouth daily as needed for allergies.     No current facility-administered medications for this visit.    Allergies: No Known Allergies I reviewed her past medical history, social history, family history, and environmental history and no significant changes have been reported from previous visit on 12/29/2018.  Review of Systems  Constitutional: Negative for chills and fever.  HENT: Negative for congestion, postnasal drip, rhinorrhea and sneezing.   Eyes: Negative for pain, discharge and itching.  Respiratory: Positive for chest tightness and shortness of breath. Negative for wheezing.   Cardiovascular: Negative.   Gastrointestinal: Negative.   Musculoskeletal: Negative for myalgias.  Skin: Positive for rash.  Neurological: Negative for headaches.   Objective: Physical Exam Not obtained as encounter was done via telephone.   Previous notes and tests were reviewed.  I discussed the assessment and treatment plan with the patient. The patient was provided an opportunity to ask questions and all were answered. The patient agreed with the plan and demonstrated an understanding of the instructions.   The patient was advised to call back or seek an in-person evaluation if the symptoms worsen or if the condition fails to improve as anticipated.  I provided 13 minutes of non-face-to-face time during this encounter.  It was my pleasure to participate in Cheryl Blanchard's care today. Please feel free to contact me with any  questions or concerns.   Sincerely,  Cheryl Panebianco Larose HiresPatricia Haydon Dorris, MD

## 2019-08-24 ENCOUNTER — Encounter (HOSPITAL_COMMUNITY): Payer: Self-pay | Admitting: Emergency Medicine

## 2019-08-24 ENCOUNTER — Emergency Department (HOSPITAL_COMMUNITY): Payer: BC Managed Care – PPO

## 2019-08-24 ENCOUNTER — Emergency Department (HOSPITAL_COMMUNITY)
Admission: EM | Admit: 2019-08-24 | Discharge: 2019-08-24 | Disposition: A | Discharge status: Home / Self Care | Payer: BC Managed Care – PPO | Attending: Emergency Medicine | Admitting: Emergency Medicine

## 2019-08-24 ENCOUNTER — Ambulatory Visit: Payer: Self-pay | Admitting: *Deleted

## 2019-08-24 ENCOUNTER — Other Ambulatory Visit: Payer: Self-pay

## 2019-08-24 DIAGNOSIS — R079 Chest pain, unspecified: Secondary | ICD-10-CM | POA: Diagnosis not present

## 2019-08-24 DIAGNOSIS — J45909 Unspecified asthma, uncomplicated: Secondary | ICD-10-CM | POA: Insufficient documentation

## 2019-08-24 DIAGNOSIS — Z79899 Other long term (current) drug therapy: Secondary | ICD-10-CM | POA: Insufficient documentation

## 2019-08-24 LAB — BASIC METABOLIC PANEL
Anion gap: 8 (ref 5–15)
BUN: 10 mg/dL (ref 6–20)
CO2: 27 mmol/L (ref 22–32)
Calcium: 9.7 mg/dL (ref 8.9–10.3)
Chloride: 103 mmol/L (ref 98–111)
Creatinine, Ser: 0.75 mg/dL (ref 0.44–1.00)
GFR calc Af Amer: >60 mL/min (ref >60)
GFR calc non Af Amer: >60 mL/min (ref >60)
Glucose, Bld: 100 mg/dL — ABNORMAL HIGH (ref 70–99)
Potassium: 3.9 mmol/L (ref 3.5–5.1)
Sodium: 138 mmol/L (ref 135–145)

## 2019-08-24 LAB — I-STAT BETA HCG BLOOD, ED (MC, WL, AP ONLY): I-stat hCG, quantitative: <5 m[IU]/mL (ref <5)

## 2019-08-24 LAB — CBC
HCT: 40.3 % (ref 36.0–46.0)
Hemoglobin: 12.8 g/dL (ref 12.0–15.0)
MCH: 27.1 pg (ref 26.0–34.0)
MCHC: 31.8 g/dL (ref 30.0–36.0)
MCV: 85.2 fL (ref 80.0–100.0)
Platelets: 331 10*3/uL (ref 150–400)
RBC: 4.73 MIL/uL (ref 3.87–5.11)
RDW: 12 % (ref 11.5–15.5)
WBC: 4.4 10*3/uL (ref 4.0–10.5)
nRBC: 0 % (ref 0.0–0.2)

## 2019-08-24 LAB — D-DIMER, QUANTITATIVE: D-Dimer, Quant: <0.27 ug/mL-FEU (ref 0.00–0.50)

## 2019-08-24 LAB — TROPONIN I (HIGH SENSITIVITY)
Troponin I (High Sensitivity): <2 ng/L (ref <18)
Troponin I (High Sensitivity): <2 ng/L (ref <18)

## 2019-08-24 MED ORDER — SODIUM CHLORIDE 0.9% FLUSH: 3.0000 mL | Freq: Once | INTRAVENOUS | Status: DC

## 2019-08-24 NOTE — ED Provider Notes (Signed)
[facility] EMERGENCY DEPARTMENT Provider Note   CSN: [id number] Arrival date & time: 08/24/19  1426     History   Chief Complaint Chief Complaint  Patient presents with  . Chest Pain    HPI Cheryl Blanchard is a 18 y.o. female.     18 year old female presents with complaint of chest pain.  Patient states that she has been having daily episodes of chest pain for the past 6 months.  States pain usually starts while she is resting, described as someone sitting in the center of her chest, lasts for 10 minutes up to several hours.  Nothing makes pain better or worse.  Patient reports associated shortness of breath yesterday after walking about a half a mile.  Denies shortness of breath at this time, states she has felt short of breath off and on with episodes previously.  Reports mild discomfort in her chest at this time.  Denies associated diaphoresis, nausea, vomiting, extremity pain, pain does not radiate.  No history of hypertension, high cholesterol, patient is a non-smoker.  No significant family history other than polycystic kidney disease.  No other complaints or concerns today.     Past Medical History:  Diagnosis Date  . Asthma   . Syncope   . Urticaria     There are no active problems to display for this patient.   History reviewed. No pertinent surgical history.   OB History   No obstetric history on file.      Home Medications    Prior to Admission medications   Medication Sig Start Date End Date Taking? Authorizing Provider  albuterol (PROVENTIL HFA;VENTOLIN HFA) 108 (90 Base) MCG/ACT inhaler Inhale into the lungs every 6 (six) hours as needed for wheezing or shortness of breath.    [provider]  levocetirizine (XYZAL) 5 MG tablet Take 5 mg by mouth daily as needed for allergies.    [provider]    Family History Family History  Problem Relation Age of Onset  . Urticaria Father   . Polycystic kidney disease Father    . Polycystic kidney disease Mother   . Polycystic kidney disease Paternal Grandmother   . Allergic rhinitis Neg Hx   . Angioedema Neg Hx   . Asthma Neg Hx   . Atopy Neg Hx   . Eczema Neg Hx   . Immunodeficiency Neg Hx     Social History Social History   Tobacco Use  . Smoking status: Never Smoker  . Smokeless tobacco: Never Used  Substance Use Topics  . Alcohol use: Yes    Comment: very rare  . Drug use: Never     Allergies   Patient has no known allergies.   Review of Systems Review of Systems  Constitutional: Negative for chills, diaphoresis and fever.  Respiratory: Positive for shortness of breath.   Cardiovascular: Positive for chest pain. Negative for palpitations and leg swelling.  Gastrointestinal: Negative for abdominal pain, constipation, diarrhea, nausea and vomiting.  Genitourinary: Negative for dysuria and frequency.  Musculoskeletal: Negative for arthralgias, back pain, myalgias, neck pain and neck stiffness.  Skin: Negative for rash and wound.  Allergic/Immunologic: Negative for immunocompromised state.  Neurological: Negative for dizziness and weakness.  Psychiatric/Behavioral: Negative for confusion.  All other systems reviewed and are negative.    Physical Exam Updated Vital Signs BP 126/74   Pulse 61   Temp 97.7 F (36.5 C) (Oral)   Resp 17   Ht 5\' 6"  (1.676 m)  Wt 72.6 kg   LMP 06/29/2019 Comment: shielded  SpO2 100%   BMI 25.82 kg/m   Physical Exam Vitals signs and nursing note reviewed.  Constitutional:      General: She is not in acute distress.    Appearance: She is well-developed. She is not diaphoretic.  HENT:     Head: Normocephalic and atraumatic.  Neck:     Musculoskeletal: Normal range of motion and neck supple.  Cardiovascular:     Rate and Rhythm: Normal rate and regular rhythm.     Heart sounds: Normal heart sounds.  Pulmonary:     Effort: Pulmonary effort is normal.     Breath sounds: Normal breath sounds.   Chest:     Chest wall: No tenderness.  Abdominal:     Palpations: Abdomen is soft.     Tenderness: There is no abdominal tenderness.  Musculoskeletal:     Right lower leg: She exhibits no tenderness.     Left lower leg: She exhibits no tenderness.  Skin:    General: Skin is warm and dry.     Findings: No rash.  Neurological:     Mental Status: She is alert and oriented to person, place, and time.  Psychiatric:        Behavior: Behavior normal.      ED Treatments / Results  Labs (all labs ordered are listed, but only abnormal results are displayed) Labs Reviewed  BASIC METABOLIC PANEL - Abnormal; Notable for the following components:      Result Value   Glucose, Bld 100 (*)    All other components within normal limits  CBC  D-DIMER, QUANTITATIVE (NOT AT [facility])  I-STAT BETA HCG BLOOD, ED ([facility], [facility], [facility] ONLY)  TROPONIN I (HIGH SENSITIVITY)  TROPONIN I (HIGH SENSITIVITY)    EKG EKG Interpretation  Date/Time:  Wednesday August 24 2019 15:02:07 EDT Ventricular Rate:  76 PR Interval:  146 QRS Duration: 86 QT Interval:  376 QTC Calculation: 423 R Axis:   102 Text Interpretation:  Normal sinus rhythm with sinus arrhythmia Rightward axis Nonspecific T wave abnormality Abnormal ECG Nonspecific TW changes new from prior Confirmed by Gareth Morgan [phone]) on 08/24/2019 5:39:32 PM   Radiology Dg Chest 2 View  Result Date: 08/24/2019 CLINICAL DATA:  Chest pain EXAM: CHEST - 2 VIEW COMPARISON:  None. FINDINGS: Lungs are clear. Heart size and pulmonary vascularity are normal. No adenopathy. No pneumothorax. 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 IMPRESSION: No edema or consolidation. Electronically Signed   By: Lowella Grip III M.D.   On: 08/24/2019 15:29    Procedures Procedures (including critical care time)  Medications Ordered in ED Medications  sodium chloride flush (NS) 0.9 % injection 3 mL (3 mLs Intravenous Not Given 08/24/19 1631)     Initial Impression /  Assessment and Plan / ED Course  I have reviewed the triage vital signs and the nursing notes.  Pertinent labs & imaging results that were available during my care of the patient were reviewed by me and considered in my medical decision making (see chart for details).  Clinical Course as of Aug 23 1909  Wed Aug 23, 5444  2848 18 year old female with complaint of midsternal chest pain for the past 6 months.  Pain is intermittent, occurs daily, occasionally associated with shortness of breath.  Exam is unremarkable, heart rate regular rate and rhythm, lung sounds are clear, no chest wall tenderness.  No significant past medical history, no significant family history, patient is a  non-smoker, no OCP use, no recent extended travel.  Lab work is reassuring including troponin x2-, d-dimer negative, CBC and BMP unremarkable, hCG negative.  Vital signs reassuring.  Reviewed EKG and history with Dr. Dalene SeltzerSchlossman, ER attending.  Patient will be discharged to follow-up with PCP.   [LM]    Clinical Course User Index [LM] Jeannie FendMurphy, Domenique Southers A, PA-C      Final Clinical Impressions(s) / ED Diagnoses   Final diagnoses:  Chest pain, unspecified type    ED Discharge Orders    None       Jeannie FendMurphy, Kamron Portee A, PA-C 08/24/19 1910    Alvira MondaySchlossman, Erin, MD 08/25/19 1221

## 2019-08-24 NOTE — Discharge Instructions (Addendum)
Your workup today is reassuring. Your heart test is normal. Your blood counts are normal as well as kidney function and electrolytes. Your chest x-ray is normal. Your blood test to check for a blood clot in the lungs is also normal.  Follow up with a primary care doctor for further testing.

## 2019-08-24 NOTE — ED Triage Notes (Signed)
C/o chest pain that comes randomly at anytime-- can be sitting still or walking, started yesterday when she was walking to class. "feels like someone is sitting on my chest"

## 2019-08-24 NOTE — Telephone Encounter (Signed)
Patient is calling to report she does not have PCP and needs to see someone for intermittent chest pain that she has been having. Advised patient per protocol- go to ED for pain she has been having- then call back to make a new patient appointment.  Reason for Disposition . [1] Chest pain lasts > 5 minutes AND [2] occurred in past 3 days (72 hours)  Answer Assessment - Initial Assessment Questions 1. LOCATION: "Where does it hurt?"       Center of breast 2. RADIATION: "Does the pain go anywhere else?" (e.g., into neck, jaw, arms, back)     No radiating 3. ONSET: "When did the chest pain begin?" (Minutes, hours or days)     Couple months 4. PATTERN "Does the pain come and go, or has it been constant since it started?"  "Does it get worse with exertion?"      Comes and goes 5. DURATION: "How long does it last" (e.g., seconds, minutes, hours)     hours 6. SEVERITY: "How bad is the pain?"  (e.g., Scale 1-10; mild, moderate, or severe)    - MILD (1-3): doesn't interfere with normal activities     - MODERATE (4-7): interferes with normal activities or awakens from sleep    - SEVERE (8-10): excruciating pain, unable to do any normal activities       mild 7. CARDIAC RISK FACTORS: "Do you have any history of heart problems or risk factors for heart disease?" (e.g., angina, prior heart attack; diabetes, high blood pressure, high cholesterol, smoker, or strong family history of heart disease)     No, smokes 8. PULMONARY RISK FACTORS: "Do you have any history of lung disease?"  (e.g., blood clots in lung, asthma, emphysema, birth control pills)     asthma 9. CAUSE: "What do you think is causing the chest pain?"     Unsure- feels like something is sitting on her chest- asthma inhaler does not help 10. OTHER SYMPTOMS: "Do you have any other symptoms?" (e.g., dizziness, nausea, vomiting, sweating, fever, difficulty breathing, cough)       Some difficultly with breathing yesterday- out of breath 11.  PREGNANCY: "Is there any chance you are pregnant?" "When was your last menstrual period?"       UPT- negative- LMP- irregular last week of July  Protocols used: CHEST PAIN-A-AH

## 2019-09-08 ENCOUNTER — Encounter: Payer: Self-pay | Admitting: Allergy

## 2019-09-08 ENCOUNTER — Other Ambulatory Visit: Payer: Self-pay

## 2019-09-08 ENCOUNTER — Ambulatory Visit (INDEPENDENT_AMBULATORY_CARE_PROVIDER_SITE_OTHER): Payer: BC Managed Care – PPO | Admitting: Allergy

## 2019-09-08 VITALS — BP 110/70 | HR 94 | Temp 97.8°F | Resp 16 | Ht 64.0 in

## 2019-09-08 DIAGNOSIS — T783XXD Angioneurotic edema, subsequent encounter: Secondary | ICD-10-CM | POA: Diagnosis not present

## 2019-09-08 DIAGNOSIS — J4520 Mild intermittent asthma, uncomplicated: Secondary | ICD-10-CM

## 2019-09-08 DIAGNOSIS — L508 Other urticaria: Secondary | ICD-10-CM | POA: Diagnosis not present

## 2019-09-08 DIAGNOSIS — R0789 Other chest pain: Secondary | ICD-10-CM | POA: Diagnosis not present

## 2019-09-08 NOTE — Patient Instructions (Addendum)
Hives  -  Improved!  -  etiology of hives and swelling likely driven by allergens.  Environmental allergy testing was positive to grass pollens, weed pollens, tree pollens and mold.  Hives can be caused by a variety of different triggers including aeroallergens, illness/infection, foods, medications, stings, exercise, pressure, vibrations, extremes of temperature to name a few however majority of the time there is no identifiable trigger.     -For management of hives if they return: take the following -- Zyrtec 10mg  or Xyzal 5 mg 1 tablet twice a day with Pepcid 20mg  1 tablet twice a day.   Let us know if his regimen is not effective for managing hives  Asthma  - have access to albuterol inhaler 2 puffs every 4-6 hours as needed for cough/wheeze/shortness of breath/chest tightness.  May use 15-20 minutes prior to activity.   Monitor frequency of use.    - concerned chest pain and shortness of breath can be driven by asthma.    - will have you trial a month of controller inhaler Pulmicort 2 puffs twice a day.   Let us know if this helps decrease chest pain and shortness of breath symptoms  Asthma control goals:   Full participation in all desired activities (may need albuterol before activity)  Albuterol use two time or less a week on average (not counting use with activity)  Cough interfering with sleep two time or less a month  Oral steroids no more than once a year  No hospitalizations  Chest pain  - as above will trial Pulmicort  - would also recommend taking Pepcid as above to help determine if there is a reflux component to chest pain  - if symptoms persist despite above recommended would recommend cardiology evaluation  Follow-up 3-4 months or sooner if needed

## 2019-09-08 NOTE — Progress Notes (Signed)
Follow-up Note  RE: Cheryl Blanchard MRN: [id number] DOB: 07-08-2001 Date of Office Visit: 09/08/2019   History of present illness: Cheryl Blanchard is a 18 y.o. female presenting today for follow-up of chronic urticaria with angioedema and asthma.  She was last seen as a telemedicine visit on 04/08/2019.  She states over the summer she has been having issues with chest pain substernally where she states it feels like something is sitting on her chest.  She also has had some shortness of breath associated with it.  She states she can get short of breath if she is walking across campus.  She did go to the ED for the symptoms on August 24, 2019.  She had a EKG that showed a rightward axis nonspecific T wave abnormality.  She had labs done including d-dimer and troponins that were both negative and normal respectively.  CBC and BMP also unremarkable.  She was discharged to follow-up with her PCP.  She states she has continued to have the chest pains sensation.  She states she did try use of her albuterol early on in the course of this and states it did not feel like it really helped that much.  She denies feeling any frank reflux symptoms.  It was this that she has not had any further hives or swelling episodes.  She is no longer taking any antihistamines at this time.  Review of systems: Review of Systems  Constitutional: Negative for chills, fever and malaise/fatigue.  HENT: Negative for congestion, ear discharge, nosebleeds and sore throat.   Eyes: Negative for pain, discharge and redness.  Respiratory: Positive for shortness of breath. Negative for cough, hemoptysis, sputum production and wheezing.   Cardiovascular: Positive for chest pain. Negative for palpitations.  Gastrointestinal: Negative for abdominal pain, constipation, diarrhea, heartburn, nausea and vomiting.  Musculoskeletal: Negative for joint pain.  Skin: Negative for itching and rash.  Neurological: Negative for headaches.     All other systems negative unless noted above in HPI  Past medical/social/surgical/family history have been reviewed and are unchanged unless specifically indicated below.  No changes  Medication List: Current Outpatient Medications  Medication Sig Dispense Refill  . albuterol (PROVENTIL HFA;VENTOLIN HFA) 108 (90 Base) MCG/ACT inhaler Inhale into the lungs every 6 (six) hours as needed for wheezing or shortness of breath.    . levocetirizine (XYZAL) 5 MG tablet Take 5 mg by mouth daily as needed for allergies.    Marland Kitchen nystatin cream (MYCOSTATIN)      No current facility-administered medications for this visit.      Known medication allergies: No Known Allergies   Physical examination: There were no vitals taken for this visit.  General: Alert, interactive, in no acute distress. HEENT: PERRLA, TMs pearly gray, turbinates non-edematous without discharge, post-pharynx non erythematous. Neck: Supple without lymphadenopathy. Lungs: Clear to auscultation without wheezing, rhonchi or rales. {no increased work of breathing. CV: Normal S1, S2 without murmurs. Abdomen: Nondistended, nontender. Skin: Warm and dry, without lesions or rashes. Extremities:  No clubbing, cyanosis or edema. Neuro:   Grossly intact.  Diagnositics/Labs:  Spirometry: FEV1: 2.8L 89%, FVC: 4.05L 114%, ratio consistent with nonobstructive pattern  Assessment and plan: Chronic urticaria with angioedema  -  Improved!  -  etiology of hives and swelling likely driven by allergens.  Environmental allergy testing was positive to grass pollens, weed pollens, tree pollens and mold.  Hives can be caused by a variety of different triggers including aeroallergens, illness/infection, foods, medications, stings, exercise, pressure, vibrations,  extremes of temperature to name a few however majority of the time there is no identifiable trigger.     -For management of hives if they return: take the following -- Zyrtec 10mg  or Xyzal  5 mg 1 tablet twice a day with Pepcid 20mg  1 tablet twice a day.   Let us know if his regimen is not effective for managing hives  Asthma  - have access to albuterol inhaler 2 puffs every 4-6 hours as needed for cough/wheeze/shortness of breath/chest tightness.  May use 15-20 minutes prior to activity.   Monitor frequency of use.    - concerned chest pain and shortness of breath can be driven by asthma.    - will have you trial a month of controller inhaler Pulmicort 2 puffs twice a day.   Let us know if this helps decrease chest pain and shortness of breath symptoms  Asthma control goals:   Full participation in all desired activities (may need albuterol before activity)  Albuterol use two time or less a week on average (not counting use with activity)  Cough interfering with sleep two time or less a month  Oral steroids no more than once a year  No hospitalizations  Chest pain  - as above will trial Pulmicort  - would also recommend taking Pepcid as above to help determine if there is a reflux component to chest pain  - if symptoms persist despite above recommended would recommend cardiology evaluation  Follow-up 3-4 months or sooner if needed     I appreciate the opportunity to take part in Braedyn's care. Please do not hesitate to contact me with questions.  Sincerely,   Prudy Feeler, MD Allergy/Immunology [facility]

## 2020-01-11 ENCOUNTER — Ambulatory Visit: Payer: BC Managed Care – PPO | Admitting: Allergy

## 2020-02-23 ENCOUNTER — Ambulatory Visit: Payer: BC Managed Care – PPO | Attending: Family

## 2020-02-23 DIAGNOSIS — Z23 Encounter for immunization: Secondary | ICD-10-CM

## 2020-02-23 NOTE — Progress Notes (Signed)
   Covid-19 Vaccination Clinic  Name:  Cheryl Blanchard    MRN: [id number] DOB: [birth year]  02/23/2020  [name] was observed post Covid-19 immunization for 15 minutes without incident. She was provided with Vaccine Information Sheet and instruction to access the V-Safe system.   [name] was instructed to call 911 with any severe reactions post vaccine: Marland Kitchen Difficulty breathing  . Swelling of face and throat  . A fast heartbeat  . A bad rash all over body  . Dizziness and weakness   Immunizations Administered    Name Date Dose VIS Date Route   Moderna COVID-19 Vaccine 02/23/2020  1:48 PM 0.5 mL 10/25/2019 Intramuscular   Manufacturer: Moderna   Lot: [id number]   NDC: 74935-521-74

## 2020-03-27 ENCOUNTER — Ambulatory Visit: Payer: BC Managed Care – PPO | Attending: Family

## 2020-03-27 DIAGNOSIS — Z23 Encounter for immunization: Secondary | ICD-10-CM

## 2020-03-27 NOTE — Progress Notes (Signed)
   Covid-19 Vaccination Clinic  Name:  [name]    MRN: [id number] DOB: [birth year]  03/27/2020  [name] was observed post Covid-19 immunization for 15 minutes without incident. She was provided with Vaccine Information Sheet and instruction to access the V-Safe system.   [name] was instructed to call 911 with any severe reactions post vaccine: Marland Kitchen Difficulty breathing  . Swelling of face and throat  . A fast heartbeat  . A bad rash all over body  . Dizziness and weakness   Immunizations Administered    Name Date Dose VIS Date Route   Moderna COVID-19 Vaccine 03/27/2020  1:22 PM 0.5 mL 10/2019 Intramuscular   Manufacturer: Moderna   Lot: [id number]   NDC: 65681-275-17

## 2020-09-03 IMAGING — CR DG CHEST 2V
2 series · 2 of 2 positions shown · non-contrast
Comparison: None.

CLINICAL DATA: Chest pain

EXAM:
CHEST - 2 VIEW

[chest pa]
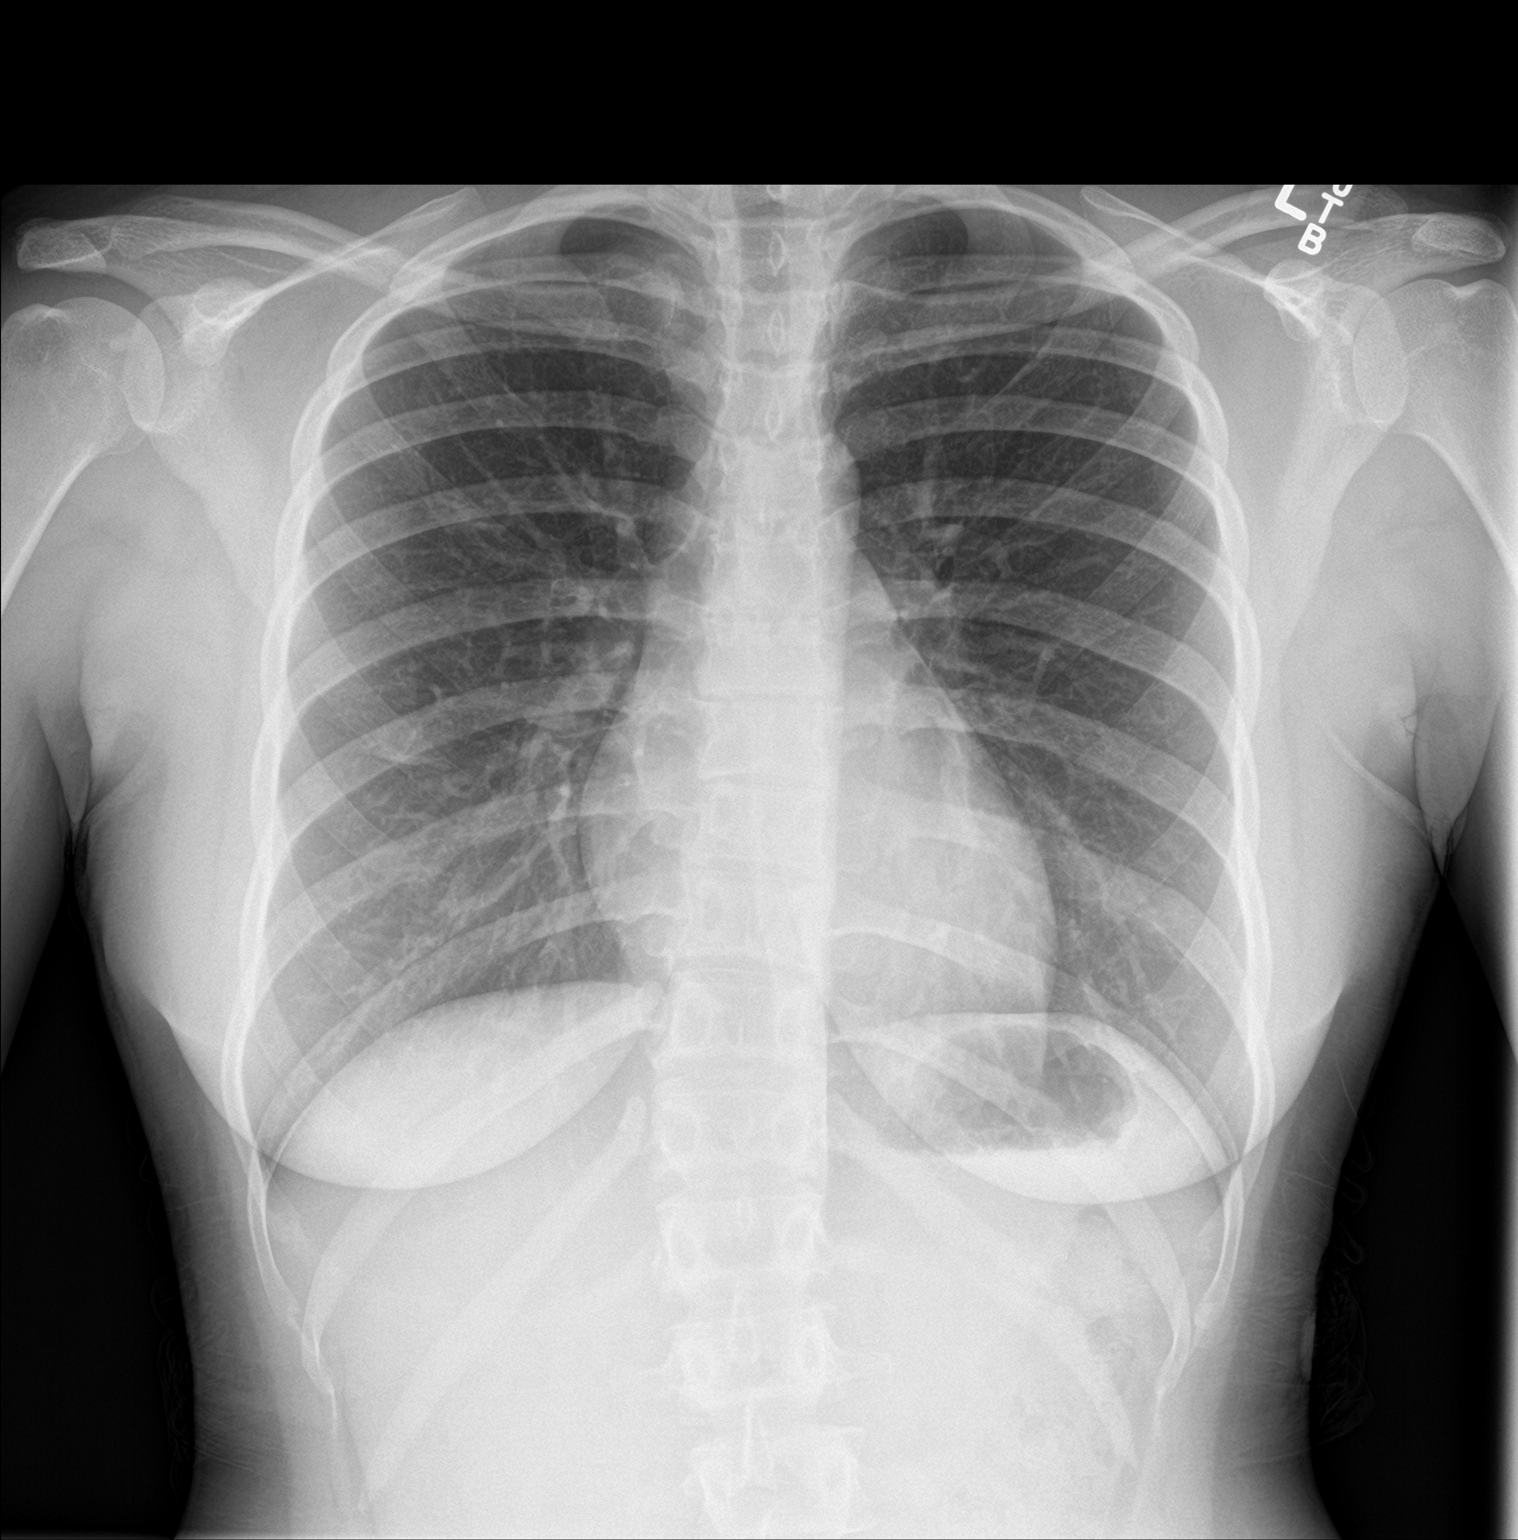

[chest lat]
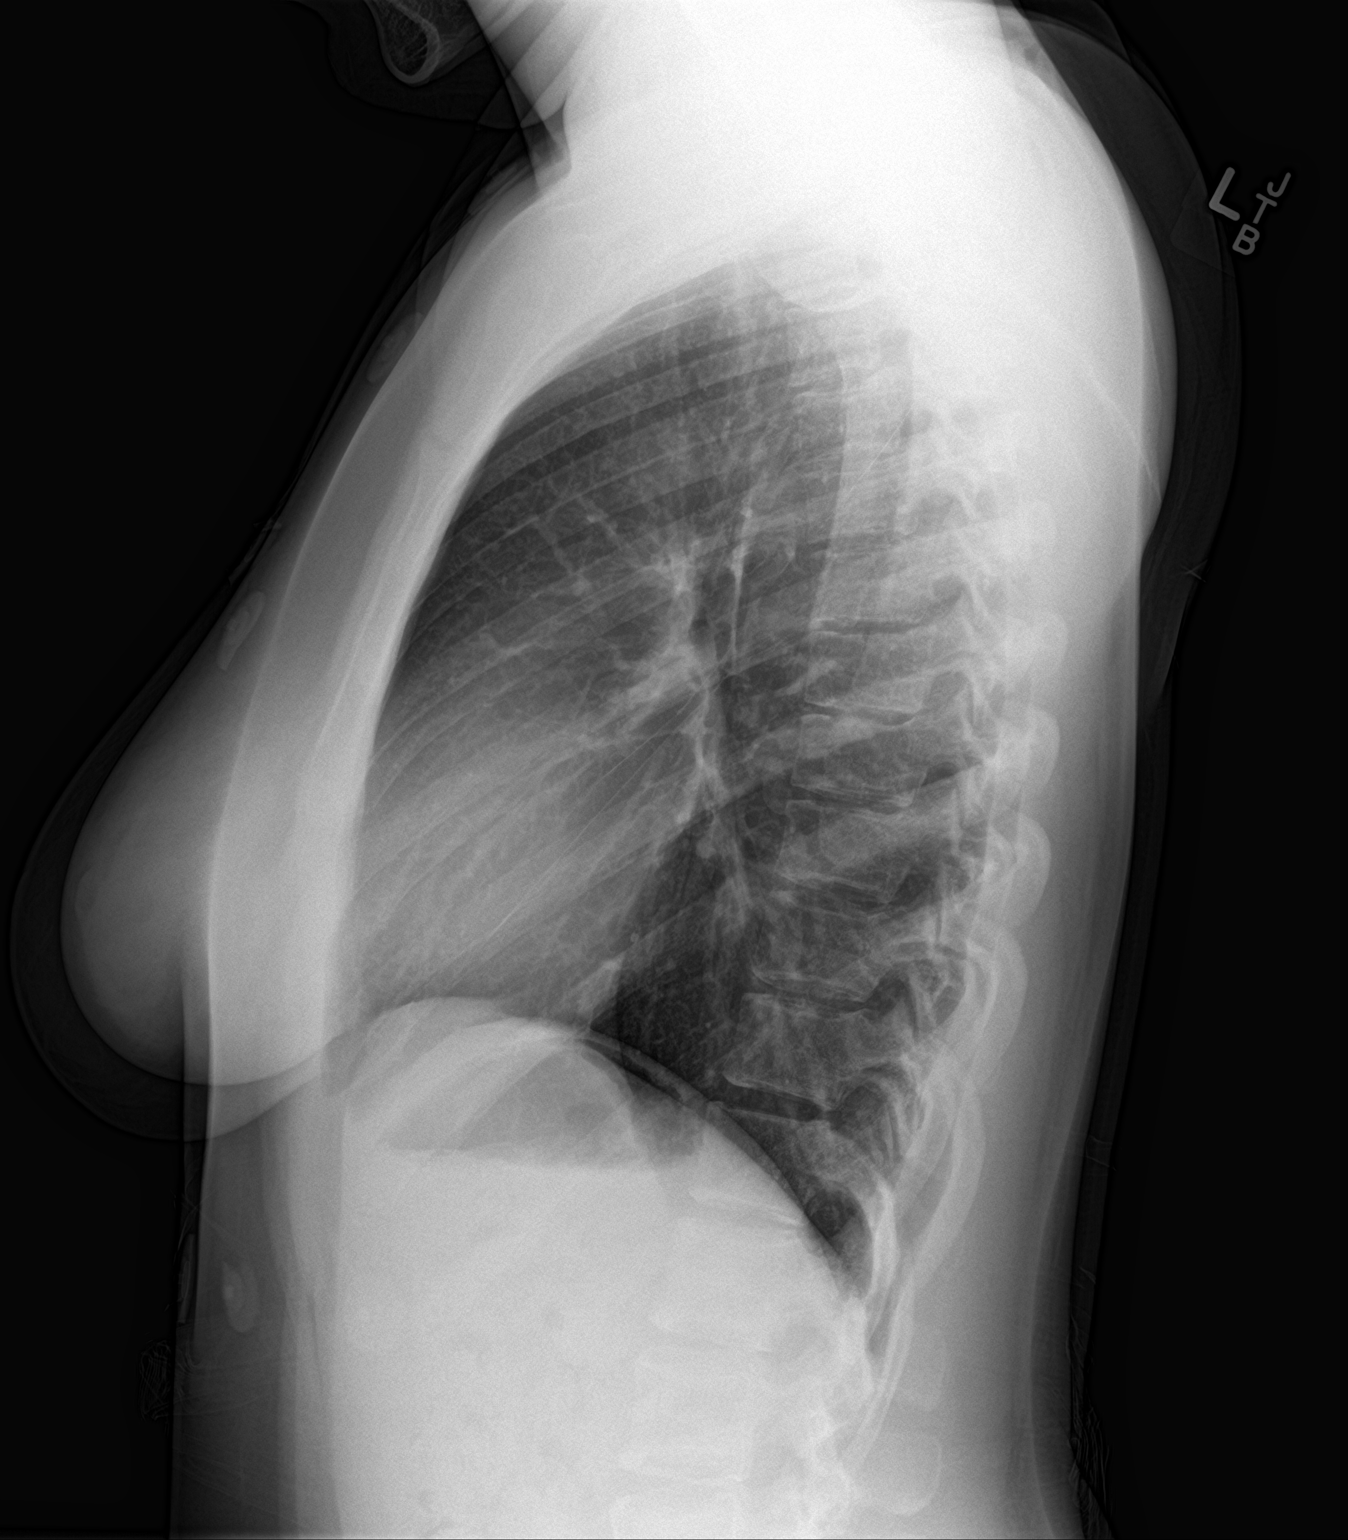

[2 of 2 positions shown; findings below may reference images not displayed]

FINDINGS: Lungs are clear. Heart size and pulmonary vascularity are normal. No
adenopathy. No pneumothorax. There is upper lumbar levoscoliosis.
IMPRESSION: No edema or consolidation.

## 2021-01-22 ENCOUNTER — Ambulatory Visit: Payer: BC Managed Care – PPO | Attending: Family

## 2021-01-22 DIAGNOSIS — Z23 Encounter for immunization: Secondary | ICD-10-CM

## 2021-04-17 NOTE — Progress Notes (Signed)
   Covid-19 Vaccination Clinic  Name:  Cheryl Blanchard    MRN: [id number] DOB: 2001/01/26  04/17/2021  [name] was observed post Covid-19 immunization for 15 minutes without incident. She was provided with Vaccine Information Sheet and instruction to access the V-Safe system.   [name] was instructed to call 911 with any severe reactions post vaccine: Marland Kitchen Difficulty breathing  . Swelling of face and throat  . A fast heartbeat  . A bad rash all over body  . Dizziness and weakness   Immunizations Administered    Name Date Dose VIS Date Route   Moderna Covid-19 Booster Vaccine 01/22/2021  9:00 AM 0.25 mL 09/12/2020 Intramuscular   Manufacturer: Moderna   Lot: [id number]   NDC: 64403-474-25
